# Patient Record
Sex: Female | Born: 1995 | Race: White | Hispanic: No | Marital: Single | Smoking: Never smoker
Health system: Southern US, Community
[De-identification: ages and names within clinical notes are randomized; demographics above are authoritative.]

## PROBLEM LIST (undated history)

## (undated) ENCOUNTER — Inpatient Hospital Stay (HOSPITAL_COMMUNITY): Payer: Self-pay

## (undated) DIAGNOSIS — Z789 Other specified health status: Secondary | ICD-10-CM

## (undated) HISTORY — DX: Other specified health status: Z78.9

## (undated) HISTORY — PX: CYST REMOVAL NECK: SHX6281

---
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
## 2015-01-09 ENCOUNTER — Ambulatory Visit (INDEPENDENT_AMBULATORY_CARE_PROVIDER_SITE_OTHER): Payer: Medicaid Other | Admitting: Obstetrics and Gynecology

## 2015-01-09 VITALS — BP 116/65 | HR 71 | Temp 98.5°F | Wt 132.7 lb

## 2015-01-09 DIAGNOSIS — O0932 Supervision of pregnancy with insufficient antenatal care, second trimester: Secondary | ICD-10-CM | POA: Diagnosis not present

## 2015-01-09 DIAGNOSIS — Z3493 Encounter for supervision of normal pregnancy, unspecified, third trimester: Secondary | ICD-10-CM

## 2015-01-09 LAB — POCT URINALYSIS DIP (DEVICE)
Bilirubin Urine: NEGATIVE
Glucose, UA: NEGATIVE mg/dL
Ketones, ur: NEGATIVE mg/dL
NITRITE: NEGATIVE
PH: 8 (ref 5.0–8.0)
PROTEIN: NEGATIVE mg/dL
Specific Gravity, Urine: 1.015 (ref 1.005–1.030)
Urobilinogen, UA: 0.2 mg/dL (ref 0.0–1.0)

## 2015-01-09 NOTE — Progress Notes (Signed)
Subjective:  Carly Mejia is a 19 y.o. G1P0000 at [redacted]w[redacted]d being seen today for ongoing prenatal care.  Patient reports no further bloody show.  Contractions: Irregular.  Vag. Bleeding: None. Movement: Present. Denies leaking of fluid.   The following portions of the patient's history were reviewed and updated as appropriate: allergies, current medications, past family history, past medical history, past social history, past surgical history and problem list.   Objective:   Filed Vitals:   01/09/15 1335  BP: 116/65  Pulse: 71  Temp: 98.5 F (36.9 C)  Weight: 132 lb 11.2 oz (60.192 kg)    Fetal Status: Fetal Heart Rate (bpm): 135   Movement: Present     General:  Alert, oriented and cooperative. Patient is in no acute distress.  Skin: Skin is warm and dry. No rash noted.   Cardiovascular: Normal heart rate noted  Respiratory: Effort and breath sounds normal, no problems with respiration noted  Abdomen: Soft, gravid, appropriate for gestational age. Pain/Pressure: Present     Vaginal: Vag. Bleeding: None.       Cervix: See above  Extremities: Normal range of motion.  Edema: None  Mental Status: Normal mood and affect. Normal behavior. Normal judgment and thought content.   Urinalysis: Urine Protein: Negative Urine Glucose: Negative  Assessment and Plan:  Pregnancy: G1P0000 at [redacted]w[redacted]d  1. Supervision of normal pregnancy, third trimester Undecided contraception> advised LARC   Term labor symptoms and general obstetric precautions including but not limited to vaginal bleeding, contractions, leaking of fluid and fetal movement were reviewed in detail with the patient.  Please refer to After Visit Summary for other counseling recommendations.   No Follow-up on file.   Danae Orleans, CNM

## 2015-01-09 NOTE — Progress Notes (Signed)
Pain- lower abd, groin area

## 2015-01-09 NOTE — Patient Instructions (Addendum)
Contraception Choices Contraception (birth control) is the use of any methods or devices to prevent pregnancy. Below are some methods to help avoid pregnancy. HORMONAL METHODS   Contraceptive implant. This is a thin, plastic tube containing progesterone hormone. It does not contain estrogen hormone. Your health care provider inserts the tube in the inner part of the upper arm. The tube can remain in place for up to 3 years. After 3 years, the implant must be removed. The implant prevents the ovaries from releasing an egg (ovulation), thickens the cervical mucus to prevent sperm from entering the uterus, and thins the lining of the inside of the uterus.  Progesterone-only injections. These injections are given every 3 months by your health care provider to prevent pregnancy. This synthetic progesterone hormone stops the ovaries from releasing eggs. It also thickens cervical mucus and changes the uterine lining. This makes it harder for sperm to survive in the uterus.  Birth control pills. These pills contain estrogen and progesterone hormone. They work by preventing the ovaries from releasing eggs (ovulation). They also cause the cervical mucus to thicken, preventing the sperm from entering the uterus. Birth control pills are prescribed by a health care provider.Birth control pills can also be used to treat heavy periods.  Minipill. This type of birth control pill contains only the progesterone hormone. They are taken every day of each month and must be prescribed by your health care provider.  Birth control patch. The patch contains hormones similar to those in birth control pills. It must be changed once a week and is prescribed by a health care provider.  Vaginal ring. The ring contains hormones similar to those in birth control pills. It is left in the vagina for 3 weeks, removed for 1 week, and then a new one is put back in place. The patient must be comfortable inserting and removing the ring  from the vagina.A health care provider's prescription is necessary.  Emergency contraception. Emergency contraceptives prevent pregnancy after unprotected sexual intercourse. This pill can be taken right after sex or up to 5 days after unprotected sex. It is most effective the sooner you take the pills after having sexual intercourse. Most emergency contraceptive pills are available without a prescription. Check with your pharmacist. Do not use emergency contraception as your only form of birth control. BARRIER METHODS   Female condom. This is a thin sheath (latex or rubber) that is worn over the penis during sexual intercourse. It can be used with spermicide to increase effectiveness.  Female condom. This is a soft, loose-fitting sheath that is put into the vagina before sexual intercourse.  Diaphragm. This is a soft, latex, dome-shaped barrier that must be fitted by a health care provider. It is inserted into the vagina, along with a spermicidal jelly. It is inserted before intercourse. The diaphragm should be left in the vagina for 6 to 8 hours after intercourse.  Cervical cap. This is a round, soft, latex or plastic cup that fits over the cervix and must be fitted by a health care provider. The cap can be left in place for up to 48 hours after intercourse.  Sponge. This is a soft, circular piece of polyurethane foam. The sponge has spermicide in it. It is inserted into the vagina after wetting it and before sexual intercourse.  Spermicides. These are chemicals that kill or block sperm from entering the cervix and uterus. They come in the form of creams, jellies, suppositories, foam, or tablets. They do not require a   prescription. They are inserted into the vagina with an applicator before having sexual intercourse. The process must be repeated every time you have sexual intercourse. INTRAUTERINE CONTRACEPTION  Intrauterine device (IUD). This is a T-shaped device that is put in a woman's uterus  during a menstrual period to prevent pregnancy. There are 2 types:  Copper IUD. This type of IUD is wrapped in copper wire and is placed inside the uterus. Copper makes the uterus and fallopian tubes produce a fluid that kills sperm. It can stay in place for 10 years.  Hormone IUD. This type of IUD contains the hormone progestin (synthetic progesterone). The hormone thickens the cervical mucus and prevents sperm from entering the uterus, and it also thins the uterine lining to prevent implantation of a fertilized egg. The hormone can weaken or kill the sperm that get into the uterus. It can stay in place for 3-5 years, depending on which type of IUD is used. PERMANENT METHODS OF CONTRACEPTION  Female tubal ligation. This is when the woman's fallopian tubes are surgically sealed, tied, or blocked to prevent the egg from traveling to the uterus.  Hysteroscopic sterilization. This involves placing a small coil or insert into each fallopian tube. Your doctor uses a technique called hysteroscopy to do the procedure. The device causes scar tissue to form. This results in permanent blockage of the fallopian tubes, so the sperm cannot fertilize the egg. It takes about 3 months after the procedure for the tubes to become blocked. You must use another form of birth control for these 3 months.  Female sterilization. This is when the female has the tubes that carry sperm tied off (vasectomy).This blocks sperm from entering the vagina during sexual intercourse. After the procedure, the man can still ejaculate fluid (semen). NATURAL PLANNING METHODS  Natural family planning. This is not having sexual intercourse or using a barrier method (condom, diaphragm, cervical cap) on days the woman could become pregnant.  Calendar method. This is keeping track of the length of each menstrual cycle and identifying when you are fertile.  Ovulation method. This is avoiding sexual intercourse during ovulation.  Symptothermal  method. This is avoiding sexual intercourse during ovulation, using a thermometer and ovulation symptoms.  Post-ovulation method. This is timing sexual intercourse after you have ovulated. Regardless of which type or method of contraception you choose, it is important that you use condoms to protect against the transmission of sexually transmitted infections (STIs). Talk with your health care provider about which form of contraception is most appropriate for you. Document Released: 07/08/2005 Document Revised: 07/13/2013 Document Reviewed: 12/31/2012 Exit[employer] Patient Information 2015 [employer]. This information is not intended to replace advice given to you by your health care provider. Make sure you discuss any questions you have with your health care provider. Etonogestrel implant What is this medicine? ETONOGESTREL (et oh noe JES trel) is a contraceptive (birth control) device. It is used to prevent pregnancy. It can be used for up to 3 years. This medicine may be used for other purposes; ask your health care provider or pharmacist if you have questions. COMMON BRAND NAME(S): Implanon, Nexplanon What should I tell my health care provider before I take this medicine? They need to know if you have any of these conditions: -abnormal vaginal bleeding -blood vessel disease or blood clots -cancer of the breast, cervix, or liver -depression -diabetes -gallbladder disease -headaches -heart disease or recent heart attack -high blood pressure -high cholesterol -kidney disease -liver disease -renal disease -seizures -tobacco   smoker -an unusual or allergic reaction to etonogestrel, other hormones, anesthetics or antiseptics, medicines, foods, dyes, or preservatives -pregnant or trying to get pregnant -breast-feeding How should I use this medicine? This device is inserted just under the skin on the inner side of your upper arm by a health care professional. Talk to your pediatrician  regarding the use of this medicine in children. Special care may be needed. Overdosage: If you think you've taken too much of this medicine contact a poison control center or emergency room at once. Overdosage: If you think you have taken too much of this medicine contact a poison control center or emergency room at once. NOTE: This medicine is only for you. Do not share this medicine with others. What if I miss a dose? This does not apply. What may interact with this medicine? Do not take this medicine with any of the following medications: -amprenavir -bosentan -fosamprenavir This medicine may also interact with the following medications: -barbiturate medicines for inducing sleep or treating seizures -certain medicines for fungal infections like ketoconazole and itraconazole -griseofulvin -medicines to treat seizures like carbamazepine, felbamate, oxcarbazepine, phenytoin, topiramate -modafinil -phenylbutazone -rifampin -some medicines to treat HIV infection like atazanavir, indinavir, lopinavir, nelfinavir, tipranavir, ritonavir -St. John's wort This list may not describe all possible interactions. Give your health care provider a list of all the medicines, herbs, non-prescription drugs, or dietary supplements you use. Also tell them if you smoke, drink alcohol, or use illegal drugs. Some items may interact with your medicine. What should I watch for while using this medicine? This product does not protect you against HIV infection (AIDS) or other sexually transmitted diseases. You should be able to feel the implant by pressing your fingertips over the skin where it was inserted. Tell your doctor if you cannot feel the implant. What side effects may I notice from receiving this medicine? Side effects that you should report to your doctor or health care professional as soon as possible: -allergic reactions like skin rash, itching or hives, swelling of the face, lips, or tongue -breast  lumps -changes in vision -confusion, trouble speaking or understanding -dark urine -depressed mood -general ill feeling or flu-like symptoms -light-colored stools -loss of appetite, nausea -right upper belly pain -severe headaches -severe pain, swelling, or tenderness in the abdomen -shortness of breath, chest pain, swelling in a leg -signs of pregnancy -sudden numbness or weakness of the face, arm or leg -trouble walking, dizziness, loss of balance or coordination -unusual vaginal bleeding, discharge -unusually weak or tired -yellowing of the eyes or skin Side effects that usually do not require medical attention (Report these to your doctor or health care professional if they continue or are bothersome.): -acne -breast pain -changes in weight -cough -fever or chills -headache -irregular menstrual bleeding -itching, burning, and vaginal discharge -pain or difficulty passing urine -sore throat This list may not describe all possible side effects. Call your doctor for medical advice about side effects. You may report side effects to FDA at 1-800-FDA-1088. Where should I keep my medicine? This drug is given in a hospital or clinic and will not be stored at home. NOTE: This sheet is a summary. It may not cover all possible information. If you have questions about this medicine, talk to your doctor, pharmacist, or health care provider.  2015, [employer]. (2012-01-13 15:37:45)

## 2015-01-15 ENCOUNTER — Inpatient Hospital Stay (HOSPITAL_COMMUNITY)
Admission: AD | Admit: 2015-01-15 | Discharge: 2015-01-15 | Disposition: A | Discharge status: Home / Self Care | Payer: Medicaid Other | Source: Ambulatory Visit | Attending: Obstetrics & Gynecology | Admitting: Obstetrics & Gynecology

## 2015-01-15 ENCOUNTER — Inpatient Hospital Stay (HOSPITAL_COMMUNITY): Payer: Medicaid Other | Admitting: Anesthesiology

## 2015-01-15 ENCOUNTER — Inpatient Hospital Stay (HOSPITAL_COMMUNITY)
Admission: AD | Admit: 2015-01-15 | Discharge: 2015-01-17 | DRG: 775 | Disposition: A | Discharge status: Home / Self Care | Payer: Medicaid Other | Source: Ambulatory Visit | Attending: Obstetrics & Gynecology | Admitting: Obstetrics & Gynecology

## 2015-01-15 ENCOUNTER — Encounter (HOSPITAL_COMMUNITY): Payer: Self-pay

## 2015-01-15 ENCOUNTER — Encounter (HOSPITAL_COMMUNITY): Payer: Self-pay | Admitting: *Deleted

## 2015-01-15 DIAGNOSIS — Z3403 Encounter for supervision of normal first pregnancy, third trimester: Secondary | ICD-10-CM | POA: Diagnosis present

## 2015-01-15 DIAGNOSIS — Z3493 Encounter for supervision of normal pregnancy, unspecified, third trimester: Secondary | ICD-10-CM | POA: Insufficient documentation

## 2015-01-15 DIAGNOSIS — Z3A39 39 weeks gestation of pregnancy: Secondary | ICD-10-CM | POA: Diagnosis present

## 2015-01-15 DIAGNOSIS — O479 False labor, unspecified: Secondary | ICD-10-CM | POA: Diagnosis present

## 2015-01-15 DIAGNOSIS — Z3A40 40 weeks gestation of pregnancy: Secondary | ICD-10-CM

## 2015-01-15 LAB — TYPE AND SCREEN
ABO/RH(D): A POS
Antibody Screen: NEGATIVE

## 2015-01-15 LAB — CBC
HEMATOCRIT: 37.4 % (ref 36.0–46.0)
Hemoglobin: 12.9 g/dL (ref 12.0–15.0)
MCH: 29.6 pg (ref 26.0–34.0)
MCHC: 34.5 g/dL (ref 30.0–36.0)
MCV: 85.8 fL (ref 78.0–100.0)
Platelets: 178 10*3/uL (ref 150–400)
RBC: 4.36 MIL/uL (ref 3.87–5.11)
RDW: 13.1 % (ref 11.5–15.5)
WBC: 22 10*3/uL — ABNORMAL HIGH (ref 4.0–10.5)

## 2015-01-15 LAB — ABO/RH: ABO/RH(D): A POS

## 2015-01-15 MED ORDER — OXYCODONE-ACETAMINOPHEN 5-325 MG PO TABS: 1.0000 | ORAL_TABLET | ORAL | Status: DC | PRN

## 2015-01-15 MED ORDER — ONDANSETRON HCL 4 MG/2ML IJ SOLN: 4.0000 mg | Freq: Four times a day (QID) | INTRAMUSCULAR | Status: DC | PRN

## 2015-01-15 MED ORDER — DIPHENHYDRAMINE HCL 25 MG PO CAPS: 25.0000 mg | ORAL_CAPSULE | Freq: Four times a day (QID) | ORAL | Status: DC | PRN

## 2015-01-15 MED ORDER — OXYTOCIN 40 UNITS IN LACTATED RINGERS INFUSION - SIMPLE MED
1.0000 m[IU]/min | INTRAVENOUS | Status: DC
  Administered 2015-01-15: 2 m[IU]/min via INTRAVENOUS
  Filled 2015-01-15: qty 1000

## 2015-01-15 MED ORDER — PHENYLEPHRINE 40 MCG/ML (10ML) SYRINGE FOR IV PUSH (FOR BLOOD PRESSURE SUPPORT)
80.0000 ug | PREFILLED_SYRINGE | INTRAVENOUS | Status: DC | PRN
  Filled 2015-01-15: qty 2
  Filled 2015-01-15: qty 20

## 2015-01-15 MED ORDER — ZOLPIDEM TARTRATE 5 MG PO TABS: 5.0000 mg | ORAL_TABLET | Freq: Every evening | ORAL | Status: DC | PRN

## 2015-01-15 MED ORDER — CITRIC ACID-SODIUM CITRATE 334-500 MG/5ML PO SOLN: 30.0000 mL | ORAL | Status: DC | PRN

## 2015-01-15 MED ORDER — OXYTOCIN 40 UNITS IN LACTATED RINGERS INFUSION - SIMPLE MED: 62.5000 mL/h | INTRAVENOUS | Status: DC | PRN

## 2015-01-15 MED ORDER — LACTATED RINGERS IV SOLN
500.0000 mL | INTRAVENOUS | Status: DC | PRN
  Administered 2015-01-15 (×3): 500 mL via INTRAVENOUS

## 2015-01-15 MED ORDER — DIBUCAINE 1 % RE OINT: 1.0000 "application " | TOPICAL_OINTMENT | RECTAL | Status: DC | PRN

## 2015-01-15 MED ORDER — LANOLIN HYDROUS EX OINT: TOPICAL_OINTMENT | CUTANEOUS | Status: DC | PRN

## 2015-01-15 MED ORDER — ACETAMINOPHEN 325 MG PO TABS: 650.0000 mg | ORAL_TABLET | ORAL | Status: DC | PRN

## 2015-01-15 MED ORDER — PRENATAL MULTIVITAMIN CH
1.0000 | ORAL_TABLET | Freq: Every day | ORAL | Status: DC
  Administered 2015-01-16 – 2015-01-17 (×2): 1 via ORAL
  Filled 2015-01-15 (×2): qty 1

## 2015-01-15 MED ORDER — OXYCODONE-ACETAMINOPHEN 5-325 MG PO TABS: 2.0000 | ORAL_TABLET | ORAL | Status: DC | PRN

## 2015-01-15 MED ORDER — TETANUS-DIPHTH-ACELL PERTUSSIS 5-2.5-18.5 LF-MCG/0.5 IM SUSP: 0.5000 mL | Freq: Once | INTRAMUSCULAR | Status: DC

## 2015-01-15 MED ORDER — EPHEDRINE 5 MG/ML INJ
10.0000 mg | INTRAVENOUS | Status: DC | PRN
  Filled 2015-01-15: qty 2

## 2015-01-15 MED ORDER — WITCH HAZEL-GLYCERIN EX PADS: 1.0000 "application " | MEDICATED_PAD | CUTANEOUS | Status: DC | PRN

## 2015-01-15 MED ORDER — LIDOCAINE HCL (PF) 1 % IJ SOLN
30.0000 mL | INTRAMUSCULAR | Status: DC | PRN
  Filled 2015-01-15: qty 30

## 2015-01-15 MED ORDER — SIMETHICONE 80 MG PO CHEW: 80.0000 mg | CHEWABLE_TABLET | ORAL | Status: DC | PRN

## 2015-01-15 MED ORDER — LACTATED RINGERS IV SOLN
INTRAVENOUS | Status: DC
  Administered 2015-01-15: 15:00:00 via INTRAUTERINE

## 2015-01-15 MED ORDER — TERBUTALINE SULFATE 1 MG/ML IJ SOLN
0.2500 mg | Freq: Once | INTRAMUSCULAR | Status: AC | PRN
  Filled 2015-01-15: qty 1

## 2015-01-15 MED ORDER — OXYTOCIN BOLUS FROM INFUSION: 500.0000 mL | INTRAVENOUS | Status: DC

## 2015-01-15 MED ORDER — SODIUM CHLORIDE 0.9 % IJ SOLN: 3.0000 mL | INTRAMUSCULAR | Status: DC | PRN

## 2015-01-15 MED ORDER — SENNOSIDES-DOCUSATE SODIUM 8.6-50 MG PO TABS
2.0000 | ORAL_TABLET | ORAL | Status: DC
  Administered 2015-01-15 – 2015-01-17 (×2): 2 via ORAL
  Filled 2015-01-15 (×2): qty 2

## 2015-01-15 MED ORDER — OXYTOCIN 40 UNITS IN LACTATED RINGERS INFUSION - SIMPLE MED
62.5000 mL/h | INTRAVENOUS | Status: DC
  Administered 2015-01-15: 62.5 mL/h via INTRAVENOUS

## 2015-01-15 MED ORDER — ONDANSETRON HCL 4 MG PO TABS: 4.0000 mg | ORAL_TABLET | ORAL | Status: DC | PRN

## 2015-01-15 MED ORDER — ONDANSETRON HCL 4 MG/2ML IJ SOLN: 4.0000 mg | INTRAMUSCULAR | Status: DC | PRN

## 2015-01-15 MED ORDER — IBUPROFEN 600 MG PO TABS
600.0000 mg | ORAL_TABLET | Freq: Four times a day (QID) | ORAL | Status: DC
  Administered 2015-01-15 – 2015-01-17 (×8): 600 mg via ORAL
  Filled 2015-01-15 (×8): qty 1

## 2015-01-15 MED ORDER — FENTANYL 2.5 MCG/ML BUPIVACAINE 1/10 % EPIDURAL INFUSION (WH - ANES)
14.0000 mL/h | INTRAMUSCULAR | Status: DC | PRN
  Administered 2015-01-15 (×2): 14 mL/h via EPIDURAL
  Filled 2015-01-15: qty 125

## 2015-01-15 MED ORDER — SODIUM CHLORIDE 0.9 % IJ SOLN: 3.0000 mL | Freq: Two times a day (BID) | INTRAMUSCULAR | Status: DC

## 2015-01-15 MED ORDER — SODIUM CHLORIDE 0.9 % IV SOLN: 250.0000 mL | INTRAVENOUS | Status: DC | PRN

## 2015-01-15 MED ORDER — DIPHENHYDRAMINE HCL 50 MG/ML IJ SOLN: 12.5000 mg | INTRAMUSCULAR | Status: DC | PRN

## 2015-01-15 MED ORDER — LACTATED RINGERS IV SOLN
INTRAVENOUS | Status: DC
  Administered 2015-01-15 (×3): via INTRAVENOUS

## 2015-01-15 MED ORDER — BENZOCAINE-MENTHOL 20-0.5 % EX AERO
1.0000 "application " | INHALATION_SPRAY | CUTANEOUS | Status: DC | PRN
  Administered 2015-01-16: 1 via TOPICAL
  Filled 2015-01-15: qty 56

## 2015-01-15 MED ORDER — LIDOCAINE HCL (PF) 1 % IJ SOLN
INTRAMUSCULAR | Status: DC | PRN
  Administered 2015-01-15: 5 mL
  Administered 2015-01-15: 3 mL
  Administered 2015-01-15: 5 mL

## 2015-01-15 NOTE — Anesthesia Procedure Notes (Signed)
Epidural Patient location during procedure: OB  Staffing Anesthesiologist: Sowmya Partridge Performed by: anesthesiologist   Preanesthetic Checklist Completed: patient identified, site marked, surgical consent, pre-op evaluation, timeout performed, IV checked, risks and benefits discussed and monitors and equipment checked  Epidural Patient position: sitting Prep: DuraPrep Patient monitoring: heart rate, continuous pulse ox and blood pressure Approach: right paramedian Location: L3-L4 Injection technique: LOR saline  Needle:  Needle type: Tuohy  Needle gauge: 17 G Needle length: 9 cm and 9 Needle insertion depth: 4 cm Catheter type: closed end flexible Catheter size: 20 Guage Catheter at skin depth: 9 cm Test dose: negative  Assessment Events: blood not aspirated, injection not painful, no injection resistance, negative IV test and no paresthesia  Additional Notes Patient identified. Risks/Benefits/Options discussed with patient including but not limited to bleeding, infection, nerve damage, paralysis, failed block, incomplete pain control, headache, blood pressure changes, nausea, vomiting, reactions to medication both or allergic, itching and postpartum back pain. Confirmed with bedside nurse the patient's most recent platelet count. Confirmed with patient that they are not currently taking any anticoagulation, have any bleeding history or any family history of bleeding disorders. Patient expressed understanding and wished to proceed. All questions were answered. Sterile technique was used throughout the entire procedure. Please see nursing notes for vital signs. Test dose was given through epidural needle and negative prior to continuing to dose epidural or start infusion. Warning signs of high block given to the patient including shortness of breath, tingling/numbness in hands, complete motor block, or any concerning symptoms with instructions to call for help. Patient was given  instructions on fall risk and not to get out of bed. All questions and concerns addressed with instructions to call with any issues.   

## 2015-01-15 NOTE — Discharge Instructions (Signed)
Braxton Hicks Contractions °Contractions of the uterus can occur throughout pregnancy. Contractions are not always a sign that you are in labor.  °WHAT ARE BRAXTON HICKS CONTRACTIONS?  °Contractions that occur before labor are called Braxton Hicks contractions, or false labor. Toward the end of pregnancy (32-34 weeks), these contractions can develop more often and may become more forceful. This is not true labor because these contractions do not result in opening (dilatation) and thinning of the cervix. They are sometimes difficult to tell apart from true labor because these contractions can be forceful and people have different pain tolerances. You should not feel embarrassed if you go to the hospital with false labor. Sometimes, the only way to tell if you are in true labor is for your health care provider to look for changes in the cervix. °If there are no prenatal problems or other health problems associated with the pregnancy, it is completely safe to be sent home with false labor and await the onset of true labor. °HOW CAN YOU TELL THE DIFFERENCE BETWEEN TRUE AND FALSE LABOR? °False Labor °· The contractions of false labor are usually shorter and not as hard as those of true labor.   °· The contractions are usually irregular.   °· The contractions are often felt in the front of the lower abdomen and in the groin.   °· The contractions may go away when you walk around or change positions while lying down.   °· The contractions get weaker and are shorter lasting as time goes on.   °· The contractions do not usually become progressively stronger, regular, and closer together as with true labor.   °True Labor °· Contractions in true labor last 30-70 seconds, become very regular, usually become more intense, and increase in frequency.   °· The contractions do not go away with walking.   °· The discomfort is usually felt in the top of the uterus and spreads to the lower abdomen and low back.   °· True labor can be  determined by your health care provider with an exam. This will show that the cervix is dilating and getting thinner.   °WHAT TO REMEMBER °· Keep up with your usual exercises and follow other instructions given by your health care provider.   °· Take medicines as directed by your health care provider.   °· Keep your regular prenatal appointments.   °· Eat and drink lightly if you think you are going into labor.   °· If Braxton Hicks contractions are making you uncomfortable:   °¨ Change your position from lying down or resting to walking, or from walking to resting.   °¨ Sit and rest in a tub of warm water.   °¨ Drink 2-3 glasses of water. Dehydration may cause these contractions.   °¨ Do slow and deep breathing several times an hour.   °WHEN SHOULD I SEEK IMMEDIATE MEDICAL CARE? °Seek immediate medical care if: °· Your contractions become stronger, more regular, and closer together.   °· You have fluid leaking or gushing from your vagina.   °· You have a fever.   °· You pass blood-tinged mucus.   °· You have vaginal bleeding.   °· You have continuous abdominal pain.   °· You have low back pain that you never had before.   °· You feel your baby's head pushing down and causing pelvic pressure.   °· Your baby is not moving as much as it used to.   °Document Released: 07/08/2005 Document Revised: 07/13/2013 Document Reviewed: 04/19/2013 °ExitCare® Patient Information ©2015 [employer]. This information is not intended to replace advice given to you by your health care   provider. Make sure you discuss any questions you have with your health care provider. ° °

## 2015-01-15 NOTE — Progress Notes (Signed)
Carly Mejia is a 19 y.o. G1P0000 at [redacted]w[redacted]d by ultrasound admitted for rupture of membranes  Subjective:   Objective: BP 100/51 mmHg  Pulse 79  Temp(Src) 97.4 F (36.3 C) (Oral)  Resp 18  Ht 5\' 3"  (1.6 m)  Wt 132 lb (59.875 kg)  BMI 23.39 kg/m2  SpO2 100%  LMP 02/19/2014      FHT:  FHR: 130 bpm, variability: moderate,  accelerations:  Present,  decelerations:  Absent UC:   regular, every 2-3 minutes and mild intensity SVE:   Dilation: 3.5 Effacement (%): 80 Station: -2 Exam by:: Ace Gins, RN  Labs: Lab Results  Component Value Date   WBC 22.0* 01/15/2015   HGB 12.9 01/15/2015   HCT 37.4 01/15/2015   MCV 85.8 01/15/2015   PLT 178 01/15/2015    Assessment / Plan: Protracted latent phase  Labor: Progressing normally Preeclampsia:  no signs or symptoms of toxicity Fetal Wellbeing:  Category I Pain Control:  Epidural I/D:  n/a Anticipated MOD:  NSVD  Crystalann Korf DARLENE 01/15/2015, 12:38 PM

## 2015-01-15 NOTE — Anesthesia Preprocedure Evaluation (Signed)
Anesthesia Evaluation  Patient identified by MRN, date of birth, ID band Patient awake    Reviewed: Allergy & Precautions, H&P , NPO status , Patient's Chart, lab work & pertinent test results  History of Anesthesia Complications Negative for: history of anesthetic complications  Airway Mallampati: II  TM Distance: >3 FB Neck ROM: full    Dental no notable dental hx. (+) Teeth Intact   Pulmonary neg pulmonary ROS,  breath sounds clear to auscultation  Pulmonary exam normal       Cardiovascular negative cardio ROS Normal cardiovascular examRhythm:regular Rate:Normal     Neuro/Psych negative neurological ROS  negative psych ROS   GI/Hepatic negative GI ROS, Neg liver ROS,   Endo/Other  negative endocrine ROS  Renal/GU negative Renal ROS  negative genitourinary   Musculoskeletal   Abdominal   Peds  Hematology negative hematology ROS (+)   Anesthesia Other Findings   Reproductive/Obstetrics (+) Pregnancy                             Anesthesia Physical Anesthesia Plan  ASA: II  Anesthesia Plan: Epidural   Post-op Pain Management:    Induction:   Airway Management Planned:   Additional Equipment:   Intra-op Plan:   Post-operative Plan:   Informed Consent: I have reviewed the patients History and Physical, chart, labs and discussed the procedure including the risks, benefits and alternatives for the proposed anesthesia with the patient or authorized representative who has indicated his/her understanding and acceptance.     Plan Discussed with:   Anesthesia Plan Comments:         Anesthesia Quick Evaluation  

## 2015-01-15 NOTE — H&P (Signed)
Carly Mejia is a 19 y.o. female presenting for SROM at 0830. Leaking sm amt cl fluid on admit fern pos. GBS neg History OB History    Gravida Para Term Preterm AB TAB SAB Ectopic Multiple Living   1 0 0 0 0 0 0 0 0 0      Past Medical History  Diagnosis Date  . Medical history non-contributory    Past Surgical History  Procedure Laterality Date  . Cyst removal neck     Family History: family history is not on file. Social History:  reports that she has never smoked. She has never used smokeless tobacco. She reports that she does not drink alcohol or use illicit drugs.   Prenatal Transfer Tool  Maternal Diabetes: No Genetic Screening: Normal Maternal Ultrasounds/Referrals: Normal Fetal Ultrasounds or other Referrals:  None Maternal Substance Abuse:  No Significant Maternal Medications:  None Significant Maternal Lab Results:  None Other Comments:  None  Review of Systems  Constitutional: Negative.   HENT: Negative.   Eyes: Negative.   Respiratory: Negative.   Cardiovascular: Negative.   Gastrointestinal: Positive for abdominal pain.  Genitourinary: Negative.   Musculoskeletal: Negative.   Skin: Negative.   Neurological: Negative.   Endo/Heme/Allergies: Negative.   Psychiatric/Behavioral: Negative.     Dilation: 2.5 Effacement (%): 80 Station: -2 Exam by:: Dellie Burns, RN BSN Temperature 98.4 F (36.9 C), weight 132 lb (59.875 kg), last menstrual period 02/19/2014. Maternal Exam:  Uterine Assessment: Contraction strength is mild.  Contraction frequency is regular.   Abdomen: Patient reports no abdominal tenderness. Fetal presentation: vertex  Introitus: Normal vulva. Normal vagina.  Ferning test: positive.  Amniotic fluid character: clear.  Pelvis: adequate for delivery.   Cervix: Cervix evaluated by digital exam.     Fetal Exam Fetal Monitor Review: Mode: ultrasound.   Variability: moderate (6-25 bpm).   Pattern: accelerations present.     Fetal State Assessment: Category I - tracings are normal.     Physical Exam  Constitutional: She is oriented to person, place, and time. She appears well-developed and well-nourished.  HENT:  Head: Normocephalic.  Eyes: Pupils are equal, round, and reactive to light.  Neck: Normal range of motion.  Cardiovascular: Normal rate, regular rhythm, normal heart sounds and intact distal pulses.   Respiratory: Effort normal and breath sounds normal.  GI: Soft. Bowel sounds are normal.  Genitourinary: Vagina normal and uterus normal.  Musculoskeletal: Normal range of motion.  Neurological: She is alert and oriented to person, place, and time. She has normal reflexes.  Skin: Skin is warm and dry.  Psychiatric: She has a normal mood and affect. Her behavior is normal. Judgment and thought content normal.    Prenatal labs: ABO, Rh: A/POS/-- (01/28 1513) Antibody: NEG (01/28 1513) Rubella: 1.09 (01/28 1513) RPR: NON REAC (03/23 0928)  HBsAg: NEGATIVE (01/28 1513)  HIV: NONREACTIVE (03/23 0928)  GBS:     Assessment/Plan: SROM at 0830 admit, expectant management   Carly Mejia 01/15/2015, 9:50 AM

## 2015-01-15 NOTE — MAU Note (Signed)
Pt presents to MAU with complaints of contractions and rupture of membranes since 830 this morning. Denies any vaginal bleeding.

## 2015-01-15 NOTE — MAU Note (Signed)
Patient presents to MAU with c/o contractions every 10 minutes. Denies LOF or VB at this time. +FM.

## 2015-01-16 LAB — HIV ANTIBODY (ROUTINE TESTING W REFLEX): HIV Screen 4th Generation wRfx: NONREACTIVE

## 2015-01-16 LAB — SYPHILIS: RPR W/REFLEX TO RPR TITER AND TREPONEMAL ANTIBODIES, TRADITIONAL SCREENING AND DIAGNOSIS ALGORITHM: RPR Ser Ql: NONREACTIVE

## 2015-01-16 NOTE — Lactation Note (Signed)
This note was copied from the chart of Carly Mejia. Lactation Consultation Note  Patient Name: Carly Mejia Date: 01/16/2015 Reason for consult: Initial assessment   Initial consult at 53 hours old; teen P1 mom.  FOB in room supporting mom.  GA 39.6; BW 7 lbs, 2.3 oz.  Mom has flat nipples; RN started on NS #16 & 20.  C/o SN. Infant has breastfed x6 (10-30, 120 min) + attempts x1 (0) in first 16 hours of life; voids-0; stools-1.  LS 6-7 by RN RN reported to [facility] mom's difficulty with infant breastfeeding, FN, SN and infant cluster feeding. Mom had just breastfed prior to [facility] entering room, but infant was lying in crib showing feeding cues.  Dad noted infant was "gassy." Upon assessment of tongue noted poster sublingual tongue restriction with upper lip frenulum extending to tip of gum line.   LC reviewed with mom hand expression.  Mom consented to letting [facility] help with latching.  LC prefilled NS #16 with .5 ml EBM.   Taught mom how to latch using asymmetrical latching technique and sandwiching of breast for latching in football hold on right side. At beginning of feeding infant had a chomping motion at breast, but as she attained depth with consistent chin tug, infant settled into a slow but consistent pattern.  Stimulation needed to keep her infant feeding.   Taught dad how to assist with latching using teacup hold and chin tug for a wider latch and flange bottom lip.  When LC involved dad with teaching dad was very engaged and asked appropriate questions. Mom is extremely tired.  Encouraged dad to do STS with infant and let mom get rest.  Discussed limiting visitors, watching feeding cues, size of infant's stomach, and cluster feeding.   Hand pump given and demonstrated how to use for EBM collection,  Collection containers, curved-tip syringe, and spoons given for collecting EBM and saving to prefill nipple shield.   LS-5 d/t audible swallows (few heard), sore nipples, assist by  LC, and flat nipples.    Educated on importance of attaining depth with flanged lips for milk transfer and prevent further nipple damage.   Did not discuss effect of tongue restriction may be causing on breastfeeding and mom's pain, as mom was very tired, and latching with depth and flanged lips at this time was more important.   Encouraged parents to keep working on latch to prevent nipple damage, and call for help as needed.   Comfort gels taken into room but did not get a chance to review; RN to review how to use c-gels.   Mom was asleep when LC went back in to review c-gels; infant was STS with dad on couch.    Lactation brochure given.  Lots of basic teaching and support given throughout consult.  Encouraged to call for help as needed.      Maternal Data Has patient been taught Hand Expression?: Yes Does the patient have breastfeeding experience prior to this delivery?: No  Feeding Feeding Type: Breast Fed Length of feed: 10 min  LATCH Score/Interventions Latch: Repeated attempts needed to sustain latch, nipple held in mouth throughout feeding, stimulation needed to elicit sucking reflex. Intervention(s): Teach feeding cues;Waking techniques Intervention(s): Breast compression;Assist with latch;Adjust position  Audible Swallowing: A few with stimulation Intervention(s): Skin to skin  Type of Nipple: Flat (Bf with #16 NS)  Comfort (Breast/Nipple): Filling, red/small blisters or bruises, mild/mod discomfort  Problem noted: Mild/Moderate discomfort Interventions (Mild/moderate discomfort): Comfort gels;Hand  expression  Hold (Positioning): Assistance needed to correctly position infant at breast and maintain latch. Intervention(s): Breastfeeding basics reviewed;Support Pillows;Skin to skin  LATCH Score: 5  Lactation Tools Discussed/Used Tools: Nipple Shields;Comfort gels Nipple shield size: 16;20 (#16 has a tighter fit; mom may choose to use either) Pump Review: Setup,  frequency, and cleaning;Milk Storage Initiated by:: Burna Sis, RN, IBCLC Date initiated:: 01/16/15   Consult Status Consult Status: Follow-up Date: 01/17/15 Follow-up type: In-patient    Carly Mejia 01/16/2015, 9:45 AM

## 2015-01-16 NOTE — Progress Notes (Signed)
Post Partum Day 1 Subjective: up ad lib, voiding, tolerating PO, + flatus and issues with breast feeding  Objective: Blood pressure 115/73, pulse 60, temperature 97.8 F (36.6 C), temperature source Oral, resp. rate 18, height 5\' 3"  (1.6 m), weight 132 lb (59.875 kg), last menstrual period 02/19/2014, SpO2 100 %, unknown if currently breastfeeding.  Physical Exam:  General: alert, cooperative, appears stated age and no distress Lochia: appropriate Uterine Fundus: firm Incision: n/a DVT Evaluation: No evidence of DVT seen on physical exam. Negative Homan's sign. No cords or calf tenderness. No significant calf/ankle edema.   Recent Labs  01/15/15 1010  HGB 12.9  HCT 37.4    Assessment/Plan: Plan for discharge tomorrow   LOS: 1 day   Carly Mejia DARLENE 01/16/2015, 7:22 AM

## 2015-01-16 NOTE — Anesthesia Postprocedure Evaluation (Signed)
  Anesthesia Post-op Note  Patient: Carly Mejia  Procedure(s) Performed: * No procedures listed *  Patient Location: Mother/Baby  Anesthesia Type:Epidural  Level of Consciousness: awake, alert , oriented and patient cooperative  Airway and Oxygen Therapy: Patient Spontanous Breathing  Post-op Pain: none  Post-op Assessment: Post-op Vital signs reviewed, Patient's Cardiovascular Status Stable, Respiratory Function Stable, Patent Airway, No headache, No backache and Patient able to bend at knees              Post-op Vital Signs: Reviewed and stable  Last Vitals:  Filed Vitals:   01/16/15 0600  BP: 115/73  Pulse: 60  Temp: 36.6 C  Resp: 18    Complications: No apparent anesthesia complications

## 2015-01-17 MED ORDER — IBUPROFEN 600 MG PO TABS: 600.0000 mg | ORAL_TABLET | Freq: Four times a day (QID) | ORAL | Status: AC

## 2015-01-17 MED ORDER — NORETHINDRONE 0.35 MG PO TABS: 1.0000 | ORAL_TABLET | Freq: Every day | ORAL | Status: AC

## 2015-01-17 MED ORDER — DOCUSATE SODIUM 100 MG PO CAPS: 100.0000 mg | ORAL_CAPSULE | Freq: Two times a day (BID) | ORAL | Status: AC | PRN

## 2015-01-17 NOTE — Discharge Summary (Signed)
Obstetric Discharge Summary Reason for Admission: rupture of membranes Prenatal Procedures: NST and ultrasound Intrapartum Procedures: spontaneous vaginal delivery Postpartum Procedures: none Complications-Operative and Postpartum: none  Delivery Summary: At 4:59 PM a viable female was delivered via SVD (Presentation:vertex ; LOA ). APGAR:8 ,9 ; weight7 lb 2.3 oz (3240 g).Placenta status:spont ,via shultz . Cord:3 vc with the following complications: none. Cord pH: n/a  Anesthesia: Epidural  Episiotomy: none Lacerations: none Suture Repair: n/a Est. Blood Loss (mL): 100   Hospital Course: Active Problems:   Irregular labor   Carly BladesCaroline J Mejia is a 19 y.o. G1P1001 s/p SVD.  Patient was admitted for SROM.  She has postpartum course that was uncomplicated including no problems with ambulating, PO intake, urination, pain, or bleeding. The pt feels ready to go home and  will be discharged with outpatient follow-up.   Today: No acute events overnight.  Pt denies problems with ambulating, voiding or po intake.  She denies nausea or vomiting.  Pain is well controlled.  She has had flatus. She has not had bowel movement.  Lochia Small.  Plan for birth control is  oral progesterone-only contraceptive.  Method of Feeding: Breast.  Physical Exam:  General: alert, cooperative and no distress Lochia: appropriate Uterine Fundus: firm DVT Evaluation: No evidence of DVT seen on physical exam.  H/H: Lab Results  Component Value Date/Time   HGB 12.9 01/15/2015 10:10 AM   HCT 37.4 01/15/2015 10:10 AM    Discharge Diagnoses: Term Pregnancy-delivered  Discharge Information: Date: 01/17/2015 Activity: pelvic rest Diet: routine  Medications: PNV, Ibuprofen and Colace Breast feeding:  Yes Condition: stable Instructions: refer to handout Discharge to: home     Medication List    STOP taking these medications        ferrous sulfate 325 (65 FE) MG tablet     terconazole 0.4 %  vaginal cream  Commonly known as:  TERAZOL 7      TAKE these medications        acetaminophen 500 MG tablet  Commonly known as:  TYLENOL  Take 1,000 mg by mouth every 6 (six) hours as needed.     docusate sodium 100 MG capsule  Commonly known as:  COLACE  Take 1 capsule (100 mg total) by mouth 2 (two) times daily as needed for mild constipation.     ibuprofen 600 MG tablet  Commonly known as:  ADVIL,MOTRIN  Take 1 tablet (600 mg total) by mouth every 6 (six) hours.     norethindrone 0.35 MG tablet  Commonly known as:  MICRONOR,CAMILA,ERRIN  Take 1 tablet (0.35 mg total) by mouth daily.  Start taking on:  01/29/2015     prenatal vitamin w/FE, FA 29-1 MG Chew chewable tablet  Chew 1 tablet by mouth daily at 12 noon.     ranitidine 150 MG tablet  Commonly known as:  ZANTAC  Take 1 tablet (150 mg total) by mouth 2 (two) times daily.       Follow-up Information    [facility]   Specialty:  Obstetrics and Gynecology   Why:  for post-partum follow-up   Contact information:   [address] [place] [id number] [phone]     Caryl AdaJazma Ezri Fanguy, DO 01/17/2015, 8:33 AM PGY-1, [facility]

## 2015-01-17 NOTE — Discharge Instructions (Signed)

## 2015-01-17 NOTE — Lactation Note (Addendum)
This note was copied from the chart of Carly Carly PapasCaroline Schmit. Lactation Consultation Note Will be a baby pt. D/t not BF well. Mom stated BF going better. Mom has semi flat nipples, mainly short shaft w/poistional stripes, not using NS that was given. Not using manual or DEBP that was set up. Mom is quiet spoken, have to ask to repeat what she is saying. Mom holding baby STS. Mom states supplemented a few times when baby wouldn't BF, but now BF much better so she isn't supplementing. Mom doesn't like the NS and states the baby doesn't like them either.  Explained the importance of a deep latch and getting milk transfer from a nipple going back into the baby's mouth so the baby can pull the milk from the breast. Explained again deep latch verses shallow latch and showed Baby and Me book picture. Discussed positioning. Encouraged monitoring and documenting I&O. Spoke with RN about concerns, RN stated the mom is very tired and frequently ask questions about things that has been explained. Hand expressed good flow of colostrum. Gave mom hand pump and encouraged to pump and relieve some of her colostrum.  Asked mom to call for latch verification for next BF.   Patient Name: Carly Carly PapasCaroline Arvie [id number]'XToday's Date: 01/17/2015 Reason for consult: Follow-up assessment   Maternal Data    Feeding Feeding Type: Breast Fed  LATCH Score/Interventions Latch: Repeated attempts needed to sustain latch, nipple held in mouth throughout feeding, stimulation needed to elicit sucking reflex. Intervention(s): Adjust position;Assist with latch  Audible Swallowing: A few with stimulation  Type of Nipple: Flat Intervention(s): Double electric pump;Shells  Comfort (Breast/Nipple): Filling, red/small blisters or bruises, mild/mod discomfort  Problem noted: Mild/Moderate discomfort Interventions  (Cracked/bleeding/bruising/blister): Double electric pump;Expressed breast milk to nipple Interventions (Mild/moderate  discomfort): Comfort gels;Hand massage;Hand expression  Hold (Positioning): No assistance needed to correctly position infant at breast. Intervention(s): Skin to skin;Position options;Support Pillows;Breastfeeding basics reviewed  LATCH Score: 6  Lactation Tools Discussed/Used Pump Review: Setup, frequency, and cleaning;Milk Storage   Consult Status Consult Status: Follow-up Date: 01/18/15 Follow-up type: In-patient    Carly DancerCARVER, Carly Mejia 01/17/2015, 11:23 AM

## 2015-01-18 ENCOUNTER — Ambulatory Visit: Payer: Self-pay

## 2015-01-18 NOTE — Lactation Note (Signed)
This note was copied from the chart of Carly Thornton PapasCaroline Abrams. Lactation Consultation Note  Patient Name: Carly Mejia [id number]'JToday's Date: 01/18/2015 Reason for consult: Follow-up assessment Lactation visit to assess progression of breastfeeding. Mother 's milk is coming to volume, transitional milk noted when mother hand expressed, mother latched baby well onto breast using breast compression and baby actively fed with swallows. Mother has red, bruised, sore nipples with small open blisters especially on the left nipple. Mother appeared to have pain when she latched baby and then states the pain lessens after a few minutes. [name] fed actively for 5 minutes and came off breast calm and relaxed. Mother's nipple was protruded and round following the feeding. Comfort gels were replaced because the other gels had been left open to air and were drying out. Mother states comfort gels are giving her relief of discomfort between feedings. Mom made aware of O/P services, breastfeeding support groups, community resources, and our phone # for post-discharge questions.     Maternal Data    Feeding Feeding Type: Breast Fed Length of feed: 5 min  LATCH Score/Interventions Latch: Grasps breast easily, tongue down, lips flanged, rhythmical sucking. Intervention(s): Skin to skin Intervention(s): Breast compression  Audible Swallowing: Spontaneous and intermittent  Type of Nipple: Everted at rest and after stimulation (following feeding)  Comfort (Breast/Nipple): Filling, red/small blisters or bruises, mild/mod discomfort  Problem noted: Filling;Cracked, bleeding, blisters, bruises;Mild/Moderate discomfort Interventions (Filling): Frequent nursing (comfort gels) Interventions  (Cracked/bleeding/bruising/blister): Expressed breast milk to nipple;Other (comment) Interventions (Mild/moderate discomfort): Comfort gels  Hold (Positioning): No assistance needed to correctly position infant at breast.  LATCH  Score: 9  Lactation Tools Discussed/Used Tools: Comfort gels WIC Program: Yes   Consult Status Consult Status: Complete Follow-up type: Call as needed    Omar PersonDaly, Destane Speas M 01/18/2015, 11:39 AM

## 2015-01-25 NOTE — Discharge Summary (Signed)
Obstetric Discharge Summary Reason for Admission: rupture of membranes Prenatal Procedures: NST and ultrasound Intrapartum Procedures: spontaneous vaginal delivery Postpartum Procedures: none Complications-Operative and Postpartum: none  Delivery Summary: At 4:59 PM a viable female was delivered via SVD (Presentation:vertex ; LOA ). APGAR:8 ,9 ; weight7 lb 2.3 oz (3240 g).Placenta status:spont ,via shultz . Cord:3 vc with the following complications: none. Cord pH: n/a  Anesthesia: Epidural  Episiotomy: none Lacerations: none Suture Repair: n/a Est. Blood Loss (mL): 100   Hospital Course: Active Problems:   Irregular labor   Carly BladesCaroline J Mejia is a 19 y.o. G1P1001 s/p SVD.  Patient was admitted for SROM.  She has postpartum course that was uncomplicated including no problems with ambulating, PO intake, urination, pain, or bleeding. The pt feels ready to go home and  will be discharged with outpatient follow-up.   Today: No acute events overnight.  Pt denies problems with ambulating, voiding or po intake.  She denies nausea or vomiting.  Pain is well controlled.  She has had flatus. She has not had bowel movement.  Lochia Small.  Plan for birth control is  oral progesterone-only contraceptive.  Method of Feeding: Breast.  Physical Exam:  General: alert, cooperative and no distress Lochia: appropriate Uterine Fundus: firm DVT Evaluation: No evidence of DVT seen on physical exam.  H/H: Lab Results  Component Value Date/Time   HGB 12.9 01/15/2015 10:10 AM   HCT 37.4 01/15/2015 10:10 AM    Discharge Diagnoses: Term Pregnancy-delivered  Discharge Information: Date: 01/25/2015 Activity: pelvic rest Diet: routine  Medications: PNV, Ibuprofen and Colace Breast feeding:  Yes Condition: stable Instructions: refer to handout Discharge to: home      Discharge Instructions    Increase activity slowly    Complete by:  As directed             Medication List     STOP taking these medications        ferrous sulfate 325 (65 FE) MG tablet     terconazole 0.4 % vaginal cream  Commonly known as:  TERAZOL 7      TAKE these medications        acetaminophen 500 MG tablet  Commonly known as:  TYLENOL  Take 1,000 mg by mouth every 6 (six) hours as needed.     docusate sodium 100 MG capsule  Commonly known as:  COLACE  Take 1 capsule (100 mg total) by mouth 2 (two) times daily as needed for mild constipation.     ibuprofen 600 MG tablet  Commonly known as:  ADVIL,MOTRIN  Take 1 tablet (600 mg total) by mouth every 6 (six) hours.     norethindrone 0.35 MG tablet  Commonly known as:  MICRONOR,CAMILA,ERRIN  Take 1 tablet (0.35 mg total) by mouth daily.  Start taking on:  01/29/2015     prenatal vitamin w/FE, FA 29-1 MG Chew chewable tablet  Chew 1 tablet by mouth daily at 12 noon.     ranitidine 150 MG tablet  Commonly known as:  ZANTAC  Take 1 tablet (150 mg total) by mouth 2 (two) times daily.       Follow-up Information    [facility]   Specialty:  Obstetrics and Gynecology   Why:  for post-partum follow-up   Contact information:   [address] [place] [id number] [phone]     Caryl AdaJazma Phelps, DO 01/25/2015, 3:12 PM PGY-1, [facility]

## 2015-02-27 ENCOUNTER — Ambulatory Visit: Payer: Medicaid Other | Admitting: Obstetrics & Gynecology

## 2015-07-07 ENCOUNTER — Encounter: Payer: Self-pay | Admitting: *Deleted

## 2015-12-21 ENCOUNTER — Ambulatory Visit (HOSPITAL_COMMUNITY)
Admission: EM | Admit: 2015-12-21 | Discharge: 2015-12-21 | Disposition: A | Discharge status: Home / Self Care | Payer: Medicaid Other | Attending: Emergency Medicine | Admitting: Emergency Medicine

## 2015-12-21 ENCOUNTER — Encounter (HOSPITAL_COMMUNITY): Payer: Self-pay | Admitting: *Deleted

## 2015-12-21 DIAGNOSIS — R0982 Postnasal drip: Secondary | ICD-10-CM | POA: Diagnosis not present

## 2015-12-21 DIAGNOSIS — R05 Cough: Secondary | ICD-10-CM | POA: Diagnosis not present

## 2015-12-21 DIAGNOSIS — R059 Cough, unspecified: Secondary | ICD-10-CM

## 2015-12-21 MED ORDER — FLUTICASONE PROPIONATE 50 MCG/ACT NA SUSP: 2.0000 | Freq: Every day | NASAL | Status: AC

## 2015-12-21 MED ORDER — PREDNISONE 50 MG PO TABS: ORAL_TABLET | ORAL | Status: AC

## 2015-12-21 NOTE — ED Provider Notes (Signed)
CSN: [id number]     Arrival date & time 12/21/15  1322 History   First MD Initiated Contact with Patient 12/21/15 1427     Chief Complaint  Patient presents with  . Cough   (Consider location/radiation/quality/duration/timing/severity/associated sxs/prior Treatment) HPI  She is a 20 year old woman here for evaluation of cough. She states this started about 2 half weeks ago. It got better, but then worsened about a week ago. Cough is worse at night. Over the last week she is also had nasal congestion, rhinorrhea, and postnasal drainage. Cough is intermittently productive. No shortness of breath or wheezing. No fevers. She did feel nauseous yesterday, but no vomiting. She has tried over-the-counter cough syrups without improvement.  Past Medical History  Diagnosis Date  . Medical history non-contributory    Past Surgical History  Procedure Laterality Date  . Cyst removal neck     History reviewed. No pertinent family history. Social History  Substance Use Topics  . Smoking status: Never Smoker   . Smokeless tobacco: Never Used  . Alcohol Use: No   OB History    Gravida Para Term Preterm AB TAB SAB Ectopic Multiple Living   1 1 1  0 0 0 0 0 0 1     Review of Systems As in history of present illness Allergies  Review of patient's allergies indicates no known allergies.  Home Medications   Prior to Admission medications   Medication Sig Start Date End Date Taking? Authorizing Provider  acetaminophen (TYLENOL) 500 MG tablet Take 1,000 mg by mouth every 6 (six) hours as needed.    Historical Provider, MD  docusate sodium (COLACE) 100 MG capsule Take 1 capsule (100 mg total) by mouth 2 (two) times daily as needed for mild constipation. 01/17/15   Pincus LargeJazma Y Phelps, DO  fluticasone (FLONASE) 50 MCG/ACT nasal spray Place 2 sprays into both nostrils daily. 12/21/15   Charm RingsErin J Honig, MD  ibuprofen (ADVIL,MOTRIN) 600 MG tablet Take 1 tablet (600 mg total) by mouth every 6 (six) hours. 01/17/15    Pincus LargeJazma Y Phelps, DO  norethindrone (MICRONOR,CAMILA,ERRIN) 0.35 MG tablet Take 1 tablet (0.35 mg total) by mouth daily. 01/29/15   Pincus LargeJazma Y Phelps, DO  predniSONE (DELTASONE) 50 MG tablet Take 1 pill daily for 5 days. 12/21/15   Charm RingsErin J Honig, MD  prenatal vitamin w/FE, FA (NATACHEW) 29-1 MG CHEW chewable tablet Chew 1 tablet by mouth daily at 12 noon.    Historical Provider, MD  ranitidine (ZANTAC) 150 MG tablet Take 1 tablet (150 mg total) by mouth 2 (two) times daily. 10/12/14   Joanna Puffrystal S Dorsey, MD   Meds Ordered and Administered this Visit  Medications - No data to display  BP 116/69 mmHg  Pulse 82  Temp(Src) 99.1 F (37.3 C) (Oral)  Resp 16  SpO2 100%  LMP 12/10/2015 No data found.   Physical Exam  Constitutional: She is oriented to person, place, and time. She appears well-developed and well-nourished. No distress.  HENT:  Mouth/Throat: No oropharyngeal exudate.  Moderate clear postnasal drainage seen. Nasal mucosa slightly erythematous.  Neck: Neck supple.  Cardiovascular: Normal rate, regular rhythm and normal heart sounds.   No murmur heard. Pulmonary/Chest: Effort normal and breath sounds normal. No respiratory distress. She has no wheezes. She has no rales.  Lymphadenopathy:    She has no cervical adenopathy.  Neurological: She is alert and oriented to person, place, and time.    ED Course  Procedures (including critical care time)  Labs Review  Labs Reviewed - No data to display  Imaging Review No results found.   MDM   1. Cough   2. PND (post-nasal drip)    Likely due to allergies versus viral URI. Symptomatic treatment with OTC allergy medication, Flonase, and short course of prednisone. Discussed that cough may linger several weeks. Follow-up as needed.    Charm Rings, MD 12/21/15 1500

## 2015-12-21 NOTE — Discharge Instructions (Signed)
The cough is coming from drainage in your throat. Get an allergy pill such as Zyrtec or Claritin at the drug store and take this once a day. Use the Flonase once a day for the next 2 weeks. Take prednisone daily for 5 days. You should see improvement within 24 hours, but the cough may linger for several weeks. Follow up as needed.

## 2015-12-21 NOTE — ED Notes (Signed)
Pt  Reports   Symptoms   Of   A  persistant    Cough  That   For   The  Most  Part  Is  Non      Productive         Pt  Reports     Symptoms  Not     releived  By  otc  meds

## 2016-10-26 IMAGING — US US OB FOLLOW-UP
1 of 2 series · 12 of 28 positions shown · non-contrast
Comparison: none

[Series 1: us ob follow up · 12 of 73 slices shown]
[im 1/73]
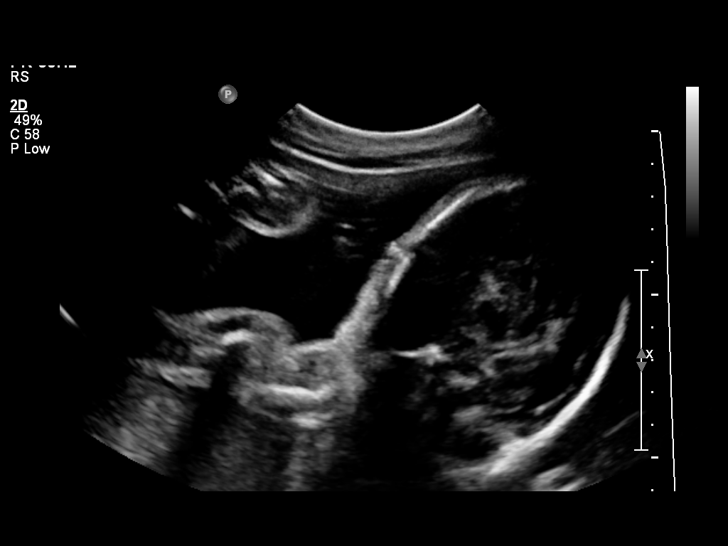
[im 6/73]
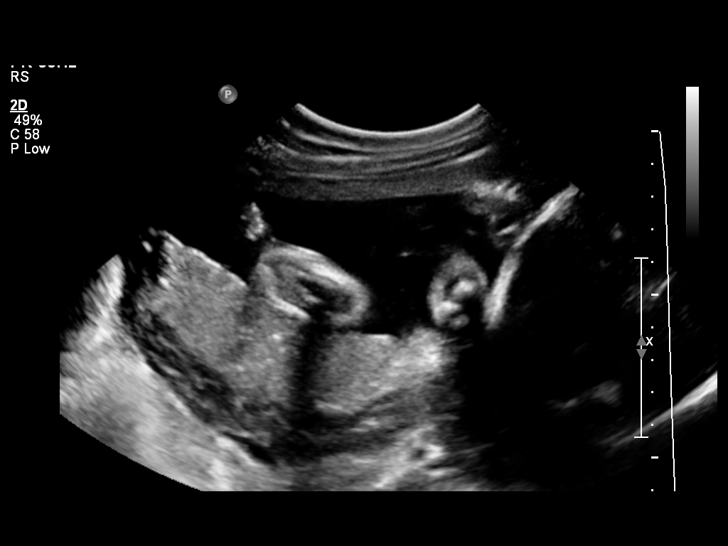
[im 12/73]
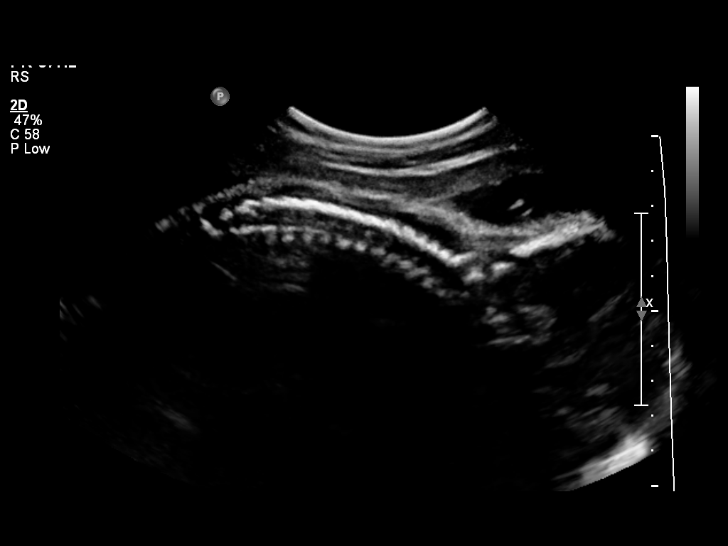
[im 20/73]
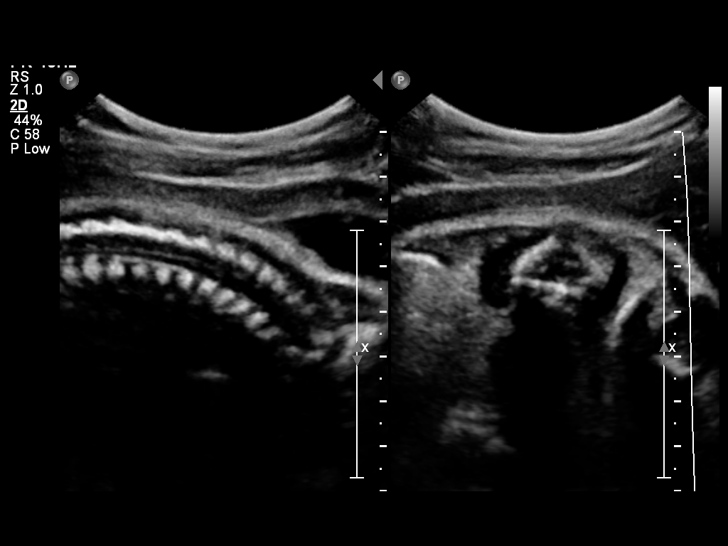
[im 25/73]
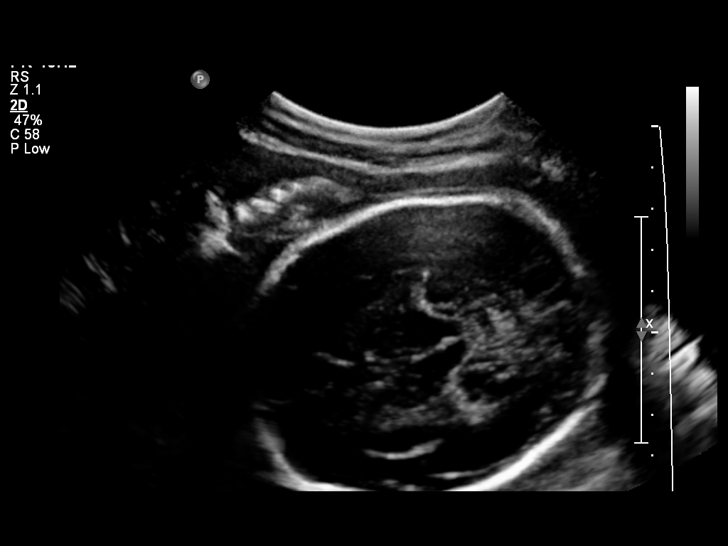
[im 31/73]
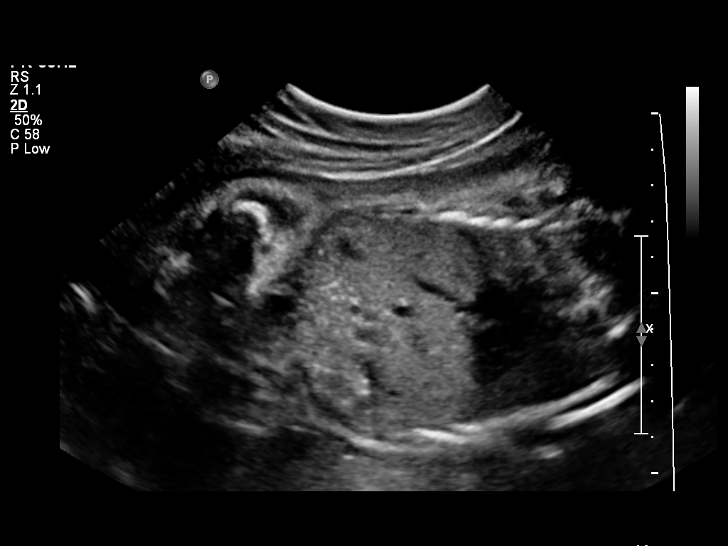
[im 39/73]
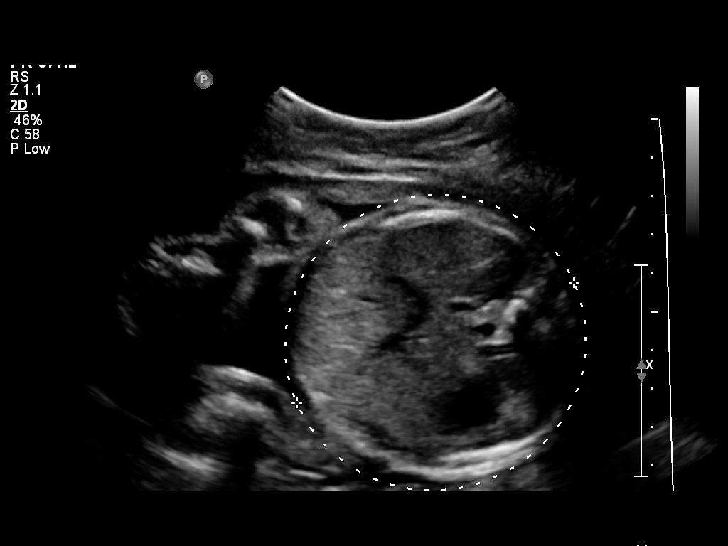
[im 45/73]
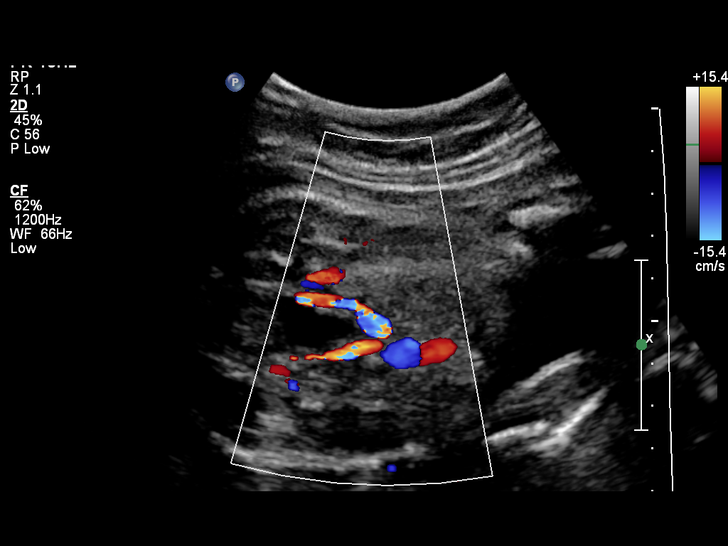
[im 50/73]
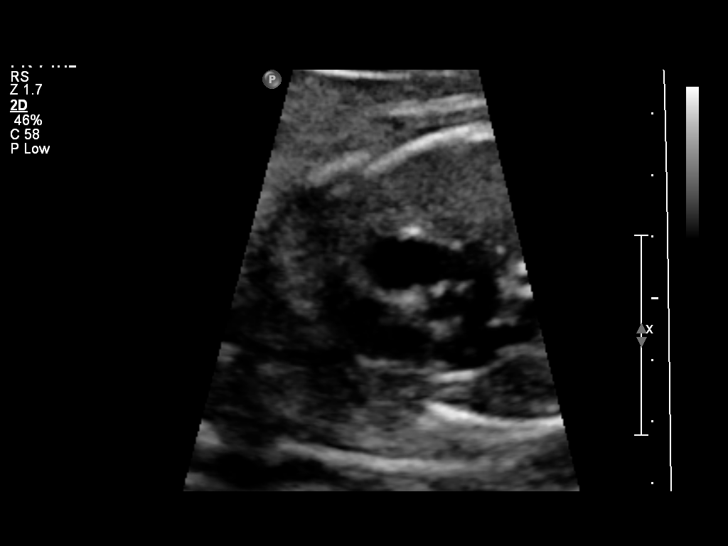
[im 59/73]
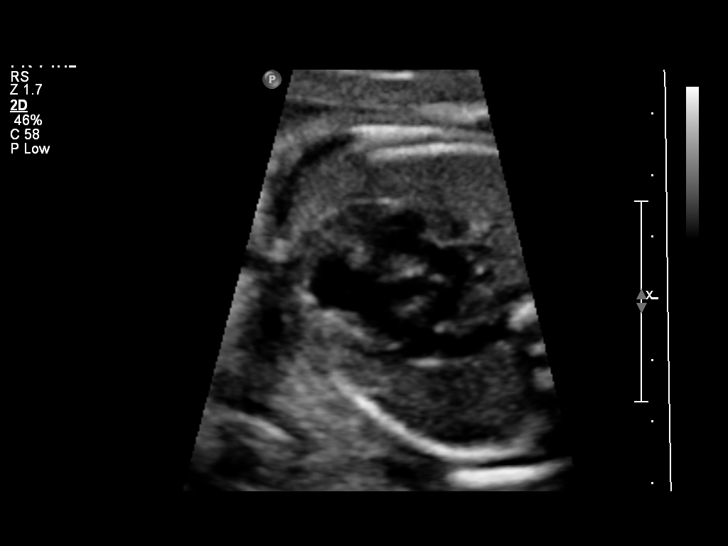
[im 64/73]
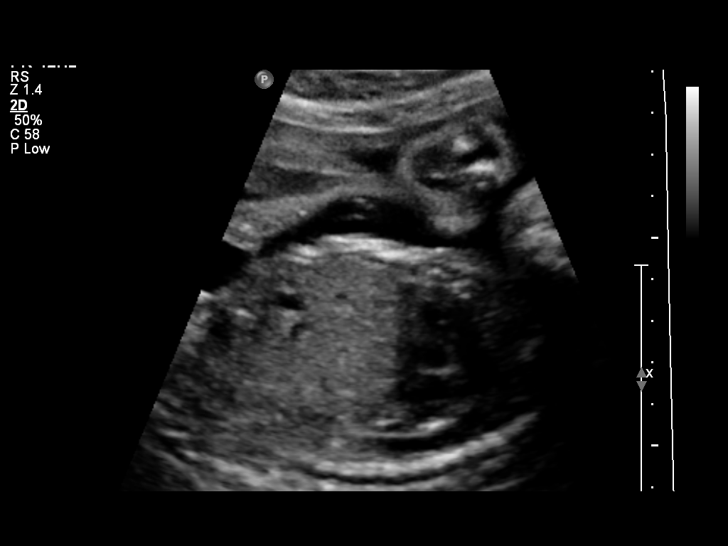
[im 70/73]
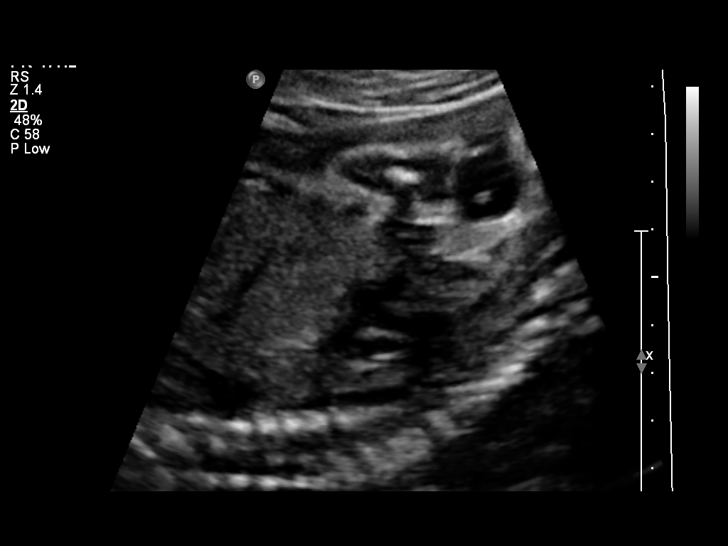

[12 of 28 positions shown; findings below may reference images not displayed]

OBSTETRICS REPORT
                      (Signed Final 10/26/2014 [DATE])

Service(s) Provided

 US OB FOLLOW UP                                       76816.1
Indications

 28 weeks gestation of pregnancy
 Follow-up incomplete fetal anatomic evaluation        Z36
Fetal Evaluation

 Num Of Fetuses:    1
 Fetal Heart Rate:  143                          bpm
 Cardiac Activity:  Observed
 Presentation:      Cephalic
 Placenta:          Posterior, above cervical
                    os
 P. Cord            Previously Visualized
 Insertion:

 Amniotic Fluid
 AFI FV:      Subjectively within normal limits
 AFI Sum:     10.23   cm       13  %Tile     Larg Pckt:    3.77  cm
 RUQ:   3.39    cm   RLQ:    3.77   cm    LLQ:   3.07    cm
Biometry

 BPD:     71.5  mm     G. Age:  28w 5d                CI:        72.31   70 - 86
                                                      FL/HC:      20.9   18.8 -

 HC:     267.5  mm     G. Age:  29w 1d       45  %    HC/AC:      1.10   1.05 -

 AC:     243.9  mm     G. Age:  28w 5d       54  %    FL/BPD:     78.0   71 - 87
 FL:      55.8  mm     G. Age:  29w 3d       66  %    FL/AC:      22.9   20 - 24
 HUM:     51.3  mm     G. Age:  30w 0d       81  %
 CER:     34.8  mm     G. Age:  29w 6d       78  %

 Est. FW:    9999  gm    2 lb 14 oz      64  %
Gestational Age

 LMP:           35w 4d        Date:  02/19/14                 EDD:   11/26/14
 U/S Today:     29w 0d                                        EDD:   01/11/15
 Best:          28w 2d     Det. By:  Early Ultrasound         EDD:   01/16/15
                                     (08/18/14)
Anatomy
 Cranium:          Appears normal         Aortic Arch:      Appears normal
 Fetal Cavum:      Appears normal         Ductal Arch:      Appears normal
 Ventricles:       Appears normal         Diaphragm:        Appears normal
 Choroid Plexus:   Appears normal         Stomach:          Appears normal, left
                                                            sided
 Cerebellum:       Appears normal         Abdomen:          Appears normal
 Posterior Fossa:  Appears normal         Abdominal Wall:   Previously seen
 Nuchal Fold:      Not applicable (>20    Cord Vessels:     Appears normal (3
                   wks GA)                                  vessel cord)
 Face:             Orbits previously      Kidneys:          Appear normal
                   seen
 Lips:             Not well visualized    Bladder:          Appears normal
 Heart:            Appears normal         Spine:            Appears normal
                   (4CH, axis, and
                   situs)
 RVOT:             Appears normal         Lower             Previously seen
                                          Extremities:
 LVOT:             Appears normal         Upper             Previously seen
                                          Extremities:

 Other:  Fetus appears to be a female.  Heels visualized.
Targeted Anatomy

 Fetal Central Nervous System
 Cisterna Magna:
Impression

 Single IUP at 28w 2d
 Normal interval anatomy.  Somewhat limited views of the fetal
 face (lips) obtained.
 Normal interval growth (64th %tile)
 Posterior placenta
 Normal amniotic fluid volume
Recommendations

 Unable to complete anatomy on today's study - Follow-up
 ultrasounds as clinically indicated.
 If an ultrasound is later performed for another indication,
 would reevalaute the fetal lips at that time

## 2017-02-06 ENCOUNTER — Ambulatory Visit (INDEPENDENT_AMBULATORY_CARE_PROVIDER_SITE_OTHER): Payer: Medicaid Other | Admitting: Obstetrics & Gynecology

## 2017-02-06 ENCOUNTER — Encounter: Payer: Self-pay | Admitting: Obstetrics & Gynecology

## 2017-02-06 VITALS — BP 107/71 | HR 58 | Wt 105.7 lb

## 2017-02-06 DIAGNOSIS — Z308 Encounter for other contraceptive management: Secondary | ICD-10-CM

## 2017-02-06 DIAGNOSIS — Z3049 Encounter for surveillance of other contraceptives: Secondary | ICD-10-CM | POA: Diagnosis not present

## 2017-02-06 NOTE — Patient Instructions (Signed)
Contraception Choices Contraception (birth control) is the use of any methods or devices to prevent pregnancy. Below are some methods to help avoid pregnancy. Hormonal methods  Contraceptive implant. This is a thin, plastic tube containing progesterone hormone. It does not contain estrogen hormone. Your health care provider inserts the tube in the inner part of the upper arm. The tube can remain in place for up to 3 years. After 3 years, the implant must be removed. The implant prevents the ovaries from releasing an egg (ovulation), thickens the cervical mucus to prevent sperm from entering the uterus, and thins the lining of the inside of the uterus.  Progesterone-only injections. These injections are given every 3 months by your health care provider to prevent pregnancy. This synthetic progesterone hormone stops the ovaries from releasing eggs. It also thickens cervical mucus and changes the uterine lining. This makes it harder for sperm to survive in the uterus.  Birth control pills. These pills contain estrogen and progesterone hormone. They work by preventing the ovaries from releasing eggs (ovulation). They also cause the cervical mucus to thicken, preventing the sperm from entering the uterus. Birth control pills are prescribed by a health care provider.Birth control pills can also be used to treat heavy periods.  Minipill. This type of birth control pill contains only the progesterone hormone. They are taken every day of each month and must be prescribed by your health care provider.  Birth control patch. The patch contains hormones similar to those in birth control pills. It must be changed once a week and is prescribed by a health care provider.  Vaginal ring. The ring contains hormones similar to those in birth control pills. It is left in the vagina for 3 weeks, removed for 1 week, and then a new one is put back in place. The patient must be comfortable inserting and removing the ring from  the vagina.A health care provider's prescription is necessary.  Emergency contraception. Emergency contraceptives prevent pregnancy after unprotected sexual intercourse. This pill can be taken right after sex or up to 5 days after unprotected sex. It is most effective the sooner you take the pills after having sexual intercourse. Most emergency contraceptive pills are available without a prescription. Check with your pharmacist. Do not use emergency contraception as your only form of birth control. Barrier methods  Female condom. This is a thin sheath (latex or rubber) that is worn over the penis during sexual intercourse. It can be used with spermicide to increase effectiveness.  Female condom. This is a soft, loose-fitting sheath that is put into the vagina before sexual intercourse.  Diaphragm. This is a soft, latex, dome-shaped barrier that must be fitted by a health care provider. It is inserted into the vagina, along with a spermicidal jelly. It is inserted before intercourse. The diaphragm should be left in the vagina for 6 to 8 hours after intercourse.  Cervical cap. This is a round, soft, latex or plastic cup that fits over the cervix and must be fitted by a health care provider. The cap can be left in place for up to 48 hours after intercourse.  Sponge. This is a soft, circular piece of polyurethane foam. The sponge has spermicide in it. It is inserted into the vagina after wetting it and before sexual intercourse.  Spermicides. These are chemicals that kill or block sperm from entering the cervix and uterus. They come in the form of creams, jellies, suppositories, foam, or tablets. They do not require a prescription. They   are inserted into the vagina with an applicator before having sexual intercourse. The process must be repeated every time you have sexual intercourse. Intrauterine contraception  Intrauterine device (IUD). This is a T-shaped device that is put in a woman's uterus during  a menstrual period to prevent pregnancy. There are 2 types: ? Copper IUD. This type of IUD is wrapped in copper wire and is placed inside the uterus. Copper makes the uterus and fallopian tubes produce a fluid that kills sperm. It can stay in place for 10 years. ? Hormone IUD. This type of IUD contains the hormone progestin (synthetic progesterone). The hormone thickens the cervical mucus and prevents sperm from entering the uterus, and it also thins the uterine lining to prevent implantation of a fertilized egg. The hormone can weaken or kill the sperm that get into the uterus. It can stay in place for 3-5 years, depending on which type of IUD is used. Permanent methods of contraception  Female tubal ligation. This is when the woman's fallopian tubes are surgically sealed, tied, or blocked to prevent the egg from traveling to the uterus.  Hysteroscopic sterilization. This involves placing a small coil or insert into each fallopian tube. Your doctor uses a technique called hysteroscopy to do the procedure. The device causes scar tissue to form. This results in permanent blockage of the fallopian tubes, so the sperm cannot fertilize the egg. It takes about 3 months after the procedure for the tubes to become blocked. You must use another form of birth control for these 3 months.  Female sterilization. This is when the female has the tubes that carry sperm tied off (vasectomy).This blocks sperm from entering the vagina during sexual intercourse. After the procedure, the man can still ejaculate fluid (semen). Natural planning methods  Natural family planning. This is not having sexual intercourse or using a barrier method (condom, diaphragm, cervical cap) on days the woman could become pregnant.  Calendar method. This is keeping track of the length of each menstrual cycle and identifying when you are fertile.  Ovulation method. This is avoiding sexual intercourse during ovulation.  Symptothermal method.  This is avoiding sexual intercourse during ovulation, using a thermometer and ovulation symptoms.  Post-ovulation method. This is timing sexual intercourse after you have ovulated. Regardless of which type or method of contraception you choose, it is important that you use condoms to protect against the transmission of sexually transmitted infections (STIs). Talk with your health care provider about which form of contraception is most appropriate for you. This information is not intended to replace advice given to you by your health care provider. Make sure you discuss any questions you have with your health care provider. Document Released: 07/08/2005 Document Revised: 12/14/2015 Document Reviewed: 12/31/2012 Elsevier Interactive Patient Education  2017 [employer]  

## 2017-02-06 NOTE — Progress Notes (Signed)
Subjective:     Patient ID: Carly Mejia, female   DOB: [birth year], 21 y.o.   MRN: [id number] Cc  Discuss birth control HPI [id number] LMP 2-3 weeks ago and she has had unprotected intercourse. She wants a reliable BCM, and has considered DMPA Past Medical History:  Diagnosis Date  . Medical history non-contributory    Past Surgical History:  Procedure Laterality Date  . CYST REMOVAL NECK     No Known Allergies Social History   Social History  . Marital status: Single    Spouse name: N/A  . Number of children: N/A  . Years of education: N/A   Occupational History  . Not on file.   Social History Main Topics  . Smoking status: Never Smoker  . Smokeless tobacco: Never Used  . Alcohol use No  . Drug use: No  . Sexual activity: Yes   Other Topics Concern  . Not on file   Social History Narrative  . No narrative on file     Review of Systems  Constitutional: Negative.   Respiratory: Negative.   Cardiovascular: Negative.   Gastrointestinal: Negative.   Genitourinary: Negative.        Objective:   Physical Exam  Constitutional: She is oriented to person, place, and time. She appears well-developed. No distress.  Cardiovascular: Normal rate.   Pulmonary/Chest: Effort normal.  Neurological: She is alert and oriented to person, place, and time.  Psychiatric: She has a normal mood and affect. Her behavior is normal.  Vitals reviewed.      Assessment:     Contraception counseling, has had unprotected intercourse recently    Plan:     Reviewed all forms of birth control options available including abstinence; over the counter/barrier methods; hormonal contraceptive medication including pill, patch, ring, injection,contraceptive implant; hormonal and nonhormonal IUDs; permanent sterilization options including vasectomy and the various tubal sterilization modalities. Risks and benefits reviewed.  Questions were answered.  Information was given to patient to review.   RTC 2 weeks and advised abstinence until then, considering DMPA and possible Nexplanon  Adam PhenixArnold, James G, MD 02/06/2017

## 2017-02-20 ENCOUNTER — Ambulatory Visit (INDEPENDENT_AMBULATORY_CARE_PROVIDER_SITE_OTHER): Payer: Medicaid Other | Admitting: General Practice

## 2017-02-20 VITALS — BP 106/66 | HR 67 | Ht 63.0 in | Wt 102.0 lb

## 2017-02-20 DIAGNOSIS — Z3202 Encounter for pregnancy test, result negative: Secondary | ICD-10-CM

## 2017-02-20 DIAGNOSIS — Z3042 Encounter for surveillance of injectable contraceptive: Secondary | ICD-10-CM

## 2017-02-20 DIAGNOSIS — Z30013 Encounter for initial prescription of injectable contraceptive: Secondary | ICD-10-CM

## 2017-02-20 LAB — POCT PREGNANCY, URINE: PREG TEST UR: NEGATIVE

## 2017-02-20 MED ORDER — MEDROXYPROGESTERONE ACETATE 150 MG/ML IM SUSP
150.0000 mg | INTRAMUSCULAR | Status: AC
  Administered 2017-02-20 – 2017-10-27 (×3): 150 mg via INTRAMUSCULAR

## 2017-05-08 ENCOUNTER — Ambulatory Visit: Payer: Medicaid Other

## 2017-05-22 ENCOUNTER — Ambulatory Visit (INDEPENDENT_AMBULATORY_CARE_PROVIDER_SITE_OTHER): Payer: Medicaid Other | Admitting: Obstetrics and Gynecology

## 2017-05-22 ENCOUNTER — Encounter: Payer: Self-pay | Admitting: Obstetrics and Gynecology

## 2017-05-22 VITALS — BP 101/66 | HR 76 | Ht 63.0 in | Wt 105.9 lb

## 2017-05-22 DIAGNOSIS — Z3042 Encounter for surveillance of injectable contraceptive: Secondary | ICD-10-CM | POA: Diagnosis present

## 2017-05-22 MED ORDER — MEDROXYPROGESTERONE ACETATE 150 MG/ML IM SUSP
150.0000 mg | Freq: Once | INTRAMUSCULAR | Status: AC
  Administered 2017-05-22: 150 mg via INTRAMUSCULAR

## 2017-05-22 NOTE — Addendum Note (Signed)
Addended by: Mikey BussingWILSON, Adaiah Jaskot L on: 05/22/2017 04:31 PM   Modules accepted: Orders

## 2017-05-22 NOTE — Progress Notes (Signed)
Patient here for depo-provera. Patient not seen by MD

## 2017-08-11 ENCOUNTER — Ambulatory Visit: Payer: Medicaid Other | Admitting: General Practice

## 2017-08-11 VITALS — BP 118/71 | HR 71 | Ht 63.0 in | Wt 106.0 lb

## 2017-08-11 DIAGNOSIS — Z3042 Encounter for surveillance of injectable contraceptive: Secondary | ICD-10-CM

## 2017-08-11 NOTE — Progress Notes (Signed)
Agree with nursing staff's documentation of this patient's clinic encounter.  Malayjah Otoole, MD 08/11/2017 2:00 PM    

## 2017-08-11 NOTE — Progress Notes (Signed)
Depoprovera given

## 2017-10-27 ENCOUNTER — Ambulatory Visit (INDEPENDENT_AMBULATORY_CARE_PROVIDER_SITE_OTHER): Payer: Medicaid Other | Admitting: General Practice

## 2017-10-27 VITALS — BP 118/63 | HR 75 | Ht 63.0 in | Wt 107.0 lb

## 2017-10-27 DIAGNOSIS — Z3042 Encounter for surveillance of injectable contraceptive: Secondary | ICD-10-CM

## 2017-10-27 NOTE — Progress Notes (Signed)
Carly Mejia here for Depo-Provera  Injection.  Injection administered without complication. Patient will return in 3 months for next injection.  Marylynn PearsonCarrie Hillman, RN 10/27/2017  10:26 AM

## 2018-01-12 ENCOUNTER — Ambulatory Visit: Payer: Medicaid Other

## 2018-02-03 ENCOUNTER — Ambulatory Visit (INDEPENDENT_AMBULATORY_CARE_PROVIDER_SITE_OTHER): Payer: Self-pay

## 2018-02-03 VITALS — BP 101/77 | HR 79 | Ht 63.0 in | Wt 109.5 lb

## 2018-02-03 DIAGNOSIS — Z3042 Encounter for surveillance of injectable contraceptive: Secondary | ICD-10-CM

## 2018-02-03 MED ORDER — MEDROXYPROGESTERONE ACETATE 150 MG/ML IM SUSP
150.0000 mg | Freq: Once | INTRAMUSCULAR | Status: AC
  Administered 2018-02-03: 150 mg via INTRAMUSCULAR

## 2018-02-03 NOTE — Progress Notes (Signed)
Carly Mejia Begin here for Depo-Provera  Injection.  Injection administered without complication. Patient will return in 3 months for next injection.  Henrietta Dineamela S Lorea Kupfer, CMA 02/03/2018  1:53 PM

## 2018-02-04 LAB — POCT PREGNANCY, URINE: PREG TEST UR: NEGATIVE

## 2018-02-05 NOTE — Progress Notes (Signed)
I have reviewed the chart and agree with nursing staff's documentation of this patient's encounter.  Catalina AntiguaPeggy Brenner Visconti, MD 02/05/2018 8:41 AM

## 2018-04-16 ENCOUNTER — Telehealth: Payer: Self-pay | Admitting: Family Medicine

## 2018-04-16 NOTE — Telephone Encounter (Signed)
Called and left patient a VM in regards to her depo appt on 10/1time being changed from 3pm to 9:30a. The office will no be closing at 1:30.

## 2018-04-21 ENCOUNTER — Ambulatory Visit: Payer: Medicaid Other

## 2018-04-29 ENCOUNTER — Ambulatory Visit (INDEPENDENT_AMBULATORY_CARE_PROVIDER_SITE_OTHER): Payer: Medicaid Other

## 2018-04-29 VITALS — BP 113/73 | HR 67 | Wt 106.6 lb

## 2018-04-29 DIAGNOSIS — Z3042 Encounter for surveillance of injectable contraceptive: Secondary | ICD-10-CM

## 2018-04-29 MED ORDER — MEDROXYPROGESTERONE ACETATE 150 MG/ML IM SUSP
150.0000 mg | Freq: Once | INTRAMUSCULAR | Status: AC
  Administered 2018-04-29: 150 mg via INTRAMUSCULAR

## 2018-04-29 NOTE — Progress Notes (Signed)
Carly Mejia here for Depo-Provera  Injection.  Injection administered without complication. Patient will return in 3 months for next injection.  Ralene Bathe, RN 04/29/2018  2:59 PM

## 2018-05-04 ENCOUNTER — Telehealth: Payer: Self-pay | Admitting: Family Medicine

## 2018-05-04 NOTE — Telephone Encounter (Signed)
Left a VM about appointment change. Please call office for detailed information about your appointment.

## 2018-07-17 ENCOUNTER — Ambulatory Visit: Payer: Medicaid Other

## 2018-08-25 ENCOUNTER — Ambulatory Visit (INDEPENDENT_AMBULATORY_CARE_PROVIDER_SITE_OTHER): Payer: Medicaid Other | Admitting: General Practice

## 2018-08-25 VITALS — BP 99/69 | HR 84 | Ht 63.0 in | Wt 100.0 lb

## 2018-08-25 DIAGNOSIS — Z3042 Encounter for surveillance of injectable contraceptive: Secondary | ICD-10-CM | POA: Diagnosis present

## 2018-08-25 LAB — POCT PREGNANCY, URINE: Preg Test, Ur: NEGATIVE

## 2018-08-25 MED ORDER — MEDROXYPROGESTERONE ACETATE 150 MG/ML IM SUSP
150.0000 mg | Freq: Once | INTRAMUSCULAR | Status: AC
  Administered 2018-08-25: 150 mg via INTRAMUSCULAR

## 2018-08-25 NOTE — Progress Notes (Signed)
I have reviewed this chart and agree with the RN/CMA assessment and management.    K. Meryl Davis, M.D. Attending [facility] (Faculty Practice)   

## 2018-08-25 NOTE — Progress Notes (Signed)
Carly Mejia here for Depo-Provera  Injection. Patient was late for depo provera injection, last injection 04/2018. Patient denies recent unprotected intercourse, negative UPT obtained. Injection administered without complication. Patient will return in 3 months for next injection.  [name], RN 08/25/2018  10:50 AM

## 2018-10-01 ENCOUNTER — Encounter: Payer: Self-pay | Admitting: Family Medicine

## 2018-10-01 ENCOUNTER — Ambulatory Visit: Payer: Medicaid Other | Admitting: Obstetrics & Gynecology

## 2018-11-10 ENCOUNTER — Telehealth: Payer: Self-pay | Admitting: Family Medicine

## 2018-11-10 NOTE — Telephone Encounter (Signed)
Attempted to call patient to verify she had our new address and if she was aware of our office process changes. No answer, left detailed message with this information as well as office number if she had any questions or concerns.

## 2018-11-11 ENCOUNTER — Ambulatory Visit: Payer: Medicaid Other

## 2018-11-30 ENCOUNTER — Telehealth: Payer: Self-pay | Admitting: Obstetrics & Gynecology

## 2018-11-30 NOTE — Telephone Encounter (Signed)
The patient called in to schedule an appointment for her depo injection. She stated she has had sex within the last two weeks, however she stated she would like to schedule her appointment. Informed I would send a message to the nurse for a consult with the patient of when the appointment can be scheduled.

## 2018-12-01 NOTE — Telephone Encounter (Signed)
Per chart review, pt last received her depo injection on 08/25/18.  Pt was supposed to receive her next injection April 20-May 4th.  Per protocol, pt will require a pregnancy test prior to next depo injection.   Returned pt's called regarding her request for a depo injection. Pt did not pick up.  Left voicemail advising pt that she was being contacted regarding her request for appointment and requesting she call the clinic.

## 2018-12-01 NOTE — Telephone Encounter (Signed)
Called patient, no answer- left message stating we are trying to reach you regarding scheduling your next appt. Please call us back so we can schedule that for you.

## 2018-12-03 ENCOUNTER — Ambulatory Visit (INDEPENDENT_AMBULATORY_CARE_PROVIDER_SITE_OTHER): Payer: Medicaid Other | Admitting: General Practice

## 2018-12-03 ENCOUNTER — Other Ambulatory Visit: Payer: Self-pay

## 2018-12-03 VITALS — BP 107/82 | HR 88 | Ht 63.0 in | Wt 104.0 lb

## 2018-12-03 DIAGNOSIS — Z3042 Encounter for surveillance of injectable contraceptive: Secondary | ICD-10-CM

## 2018-12-03 LAB — POCT PREGNANCY, URINE: Preg Test, Ur: NEGATIVE

## 2018-12-03 MED ORDER — MEDROXYPROGESTERONE ACETATE 150 MG/ML IM SUSP
150.0000 mg | Freq: Once | INTRAMUSCULAR | Status: AC
Start: 2018-12-03 — End: 2018-12-03
  Administered 2018-12-03: 150 mg via INTRAMUSCULAR

## 2018-12-03 NOTE — Progress Notes (Signed)
Carly Mejia here for Depo-Provera  Injection.  Injection administered without complication. Patient will return in 3 months for next injection.  [name], RN 12/03/2018  2:54 PM

## 2018-12-03 NOTE — Progress Notes (Signed)
I agree with the nurses note and documentation.   Duane Lope, NP 12/03/2018 4:21 PM

## 2019-02-18 ENCOUNTER — Ambulatory Visit: Payer: Medicaid Other

## 2019-03-10 ENCOUNTER — Ambulatory Visit: Payer: Medicaid Other

## 2019-03-18 ENCOUNTER — Ambulatory Visit: Payer: Medicaid Other
